# Patient Record
Sex: Female | Born: 1960 | Race: White | Hispanic: No | Marital: Married
Health system: Southern US, Community
[De-identification: ages and names within clinical notes are randomized; demographics above are authoritative.]

---

## 1985-10-31 HISTORY — PX: BREAST EXCISIONAL BIOPSY: SUR124

## 1998-08-11 ENCOUNTER — Other Ambulatory Visit: Admission: RE | Admit: 1998-08-11 | Discharge: 1998-08-11 | Payer: Self-pay | Admitting: Obstetrics and Gynecology

## 1999-03-06 ENCOUNTER — Inpatient Hospital Stay (HOSPITAL_COMMUNITY): Admission: AD | Admit: 1999-03-06 | Discharge: 1999-03-08 | Payer: Self-pay | Admitting: Obstetrics and Gynecology

## 1999-04-16 ENCOUNTER — Other Ambulatory Visit: Admission: RE | Admit: 1999-04-16 | Discharge: 1999-04-16 | Payer: Self-pay | Admitting: *Deleted

## 1999-04-29 ENCOUNTER — Other Ambulatory Visit: Admission: RE | Admit: 1999-04-29 | Discharge: 1999-04-29 | Payer: Self-pay | Admitting: Obstetrics and Gynecology

## 2000-05-25 ENCOUNTER — Other Ambulatory Visit: Admission: RE | Admit: 2000-05-25 | Discharge: 2000-05-25 | Payer: Self-pay | Admitting: Obstetrics and Gynecology

## 2001-05-10 ENCOUNTER — Other Ambulatory Visit: Admission: RE | Admit: 2001-05-10 | Discharge: 2001-05-10 | Payer: Self-pay | Admitting: Internal Medicine

## 2001-06-26 ENCOUNTER — Encounter: Admission: RE | Admit: 2001-06-26 | Discharge: 2001-06-26 | Payer: Self-pay | Admitting: Internal Medicine

## 2001-06-26 ENCOUNTER — Encounter: Payer: Self-pay | Admitting: Internal Medicine

## 2001-10-01 ENCOUNTER — Ambulatory Visit (HOSPITAL_COMMUNITY): Admission: RE | Admit: 2001-10-01 | Discharge: 2001-10-01 | Payer: Self-pay | Admitting: Internal Medicine

## 2001-10-01 ENCOUNTER — Encounter: Payer: Self-pay | Admitting: Internal Medicine

## 2002-10-18 ENCOUNTER — Encounter: Payer: Self-pay | Admitting: Internal Medicine

## 2002-10-18 ENCOUNTER — Encounter: Admission: RE | Admit: 2002-10-18 | Discharge: 2002-10-18 | Payer: Self-pay | Admitting: Internal Medicine

## 2003-11-13 ENCOUNTER — Other Ambulatory Visit: Admission: RE | Admit: 2003-11-13 | Discharge: 2003-11-13 | Payer: Self-pay | Admitting: Internal Medicine

## 2003-11-25 ENCOUNTER — Encounter: Admission: RE | Admit: 2003-11-25 | Discharge: 2003-11-25 | Payer: Self-pay | Admitting: Internal Medicine

## 2004-11-30 ENCOUNTER — Encounter: Admission: RE | Admit: 2004-11-30 | Discharge: 2004-11-30 | Payer: Self-pay | Admitting: Internal Medicine

## 2004-12-27 ENCOUNTER — Other Ambulatory Visit: Admission: RE | Admit: 2004-12-27 | Discharge: 2004-12-27 | Payer: Self-pay | Admitting: Internal Medicine

## 2005-12-06 ENCOUNTER — Encounter: Admission: RE | Admit: 2005-12-06 | Discharge: 2005-12-06 | Payer: Self-pay | Admitting: Internal Medicine

## 2005-12-23 ENCOUNTER — Other Ambulatory Visit: Admission: RE | Admit: 2005-12-23 | Discharge: 2005-12-23 | Payer: Self-pay | Admitting: Obstetrics and Gynecology

## 2006-01-06 ENCOUNTER — Encounter: Admission: RE | Admit: 2006-01-06 | Discharge: 2006-01-06 | Payer: Self-pay | Admitting: Family Medicine

## 2006-09-19 ENCOUNTER — Encounter (INDEPENDENT_AMBULATORY_CARE_PROVIDER_SITE_OTHER): Payer: Self-pay | Admitting: *Deleted

## 2006-09-19 ENCOUNTER — Inpatient Hospital Stay (HOSPITAL_COMMUNITY): Admission: RE | Admit: 2006-09-19 | Discharge: 2006-09-20 | Payer: Self-pay | Admitting: Obstetrics and Gynecology

## 2006-12-29 ENCOUNTER — Encounter: Admission: RE | Admit: 2006-12-29 | Discharge: 2006-12-29 | Payer: Self-pay | Admitting: Family Medicine

## 2007-09-25 ENCOUNTER — Encounter: Admission: RE | Admit: 2007-09-25 | Discharge: 2007-09-25 | Payer: Self-pay | Admitting: Obstetrics and Gynecology

## 2007-12-19 ENCOUNTER — Encounter: Admission: RE | Admit: 2007-12-19 | Discharge: 2007-12-19 | Payer: Self-pay | Admitting: Family Medicine

## 2009-01-01 ENCOUNTER — Encounter: Admission: RE | Admit: 2009-01-01 | Discharge: 2009-01-01 | Payer: Self-pay | Admitting: Family Medicine

## 2010-01-05 ENCOUNTER — Encounter: Admission: RE | Admit: 2010-01-05 | Discharge: 2010-01-05 | Payer: Self-pay | Admitting: Family Medicine

## 2010-12-15 ENCOUNTER — Other Ambulatory Visit: Payer: Self-pay | Admitting: Family Medicine

## 2010-12-15 DIAGNOSIS — Z1231 Encounter for screening mammogram for malignant neoplasm of breast: Secondary | ICD-10-CM

## 2011-01-11 ENCOUNTER — Ambulatory Visit
Admission: RE | Admit: 2011-01-11 | Discharge: 2011-01-11 | Disposition: A | Discharge status: Home / Self Care | Payer: PRIVATE HEALTH INSURANCE | Source: Ambulatory Visit | Attending: Family Medicine | Admitting: Family Medicine

## 2011-01-11 DIAGNOSIS — Z1231 Encounter for screening mammogram for malignant neoplasm of breast: Secondary | ICD-10-CM

## 2011-03-18 NOTE — Op Note (Signed)
Kimberly Franklin, Kimberly Franklin               ACCOUNT NO.:  [id number]   MEDICAL RECORD NO.:  [id number]          PATIENT TYPE:  INP   LOCATION:  9317                          FACILITY:  [facility]   PHYSICIAN:  Randye Lobo, M.D.   DATE OF BIRTH:  05-14-1961   DATE OF PROCEDURE:  09/19/2006  DATE OF DISCHARGE:                               OPERATIVE REPORT   PREOPERATIVE DIAGNOSIS:  1. Incomplete uterovaginal prolapse.  2. Genuine stress incontinence.   POSTOPERATIVE DIAGNOSIS:  1. Incomplete uterovaginal prolapse.  2. Genuine stress incontinence.   PROCEDURE:  Total vaginal hysterectomy, McCall culdoplasty, anterior and  posterior colporrhaphy, TVT sling, cystoscopy.   SURGEON:  Conley Simmonds, M.D.   ASSISTANT:  Lodema Hong, M.D.   ANESTHESIA:  Epidural, local with 0.5% lidocaine with 1:200,000 of  epinephrine.   IV FLUIDS:  3450 mL Ringer's lactate.   ESTIMATED BLOOD LOSS:  300 mL   URINE OUTPUT:  2700 mL   COMPLICATIONS:  None.   INDICATIONS FOR PROCEDURE:  The patient is a 50 year old para 2  Caucasian female who presents with a complaint of urinary incontinence  with forceful maneuvers.  The patient has evidence of stress  incontinence with urodynamic studies.  On pelvic examination the patient  is noted to have a first to second degree cystocele, first to second  degree uterine prolapse, and a second-degree rectocele.  A plan is made  to proceed with a total vaginal hysterectomy with vaginal vault  suspension, anterior and posterior colporrhaphy and tension-free vaginal  tape suburethral sling and cystoscopy after risks, benefits, and  alternatives are discussed.   FINDINGS:  Exam under anesthesia revealed a first degree cystocele,  first-degree uterine prolapse, and a second-degree rectocele.   Cystoscopy demonstrated the absence of a foreign body in the bladder or  in the urethra.  The bladder was visualized throughout 360 degrees  including the bladder dome and trigone.   The ureters were noted to be  patent bilaterally.   SPECIMENS:  The uterus was sent to pathology.   PROCEDURE:  The patient is reidentified in the preoperative hold area.  The patient did receive Ancef 1 gram IV for antibiotic prophylaxis.  She  received both TED hose and PAS stockings for DVT prophylaxis.   Prior to going to the operating room, the patient received an epidural  catheter.  In the operating room her epidural was dosed.  The patient  was noted to have inadequate anesthesia and there was blood return  coming from an epidural catheter.  The epidural catheter was therefore  removed and was replaced by anesthesia.  The epidural then became  functional.   The patient was placed in the dorsal lithotomy position and the lower  abdomen and vagina were sterilely prepped and draped.  A Foley catheter  was placed inside the bladder.  A weighted speculum was placed inside  the vagina and tenaculums were placed on the anterior and posterior  cervical lips.  The cervix was circumferentially injected with half  percent lidocaine with 1:200,000 of epinephrine.  The cervix was  circumscribed with a scalpel.  The posterior cul-de-sac was entered  sharply and digital exam confirmed proper entry into the cul-de-sac.  The posterior vaginal cuff was marked with a suture of 0 Vicryl.  A long  weighted speculum was then placed in the posterior cul-de-sac.  Each of  the uterosacral ligaments were clamped, sharply divided, and suture  ligated with transfixing sutures of 0 Vicryl.  The bladder was dissected  away from the anterior cervix.  A second clamp was placed on the upper  part of the uterosacral ligaments.  They were then sharply divided, and  again suture-ligated with 0 Vicryl.  The anterior cul-de-sac was entered  sharply at this time and again digital exam confirmed proper entry into  this location.  The cardinal ligaments were then sequentially clamped,  sharply divided, and suture  ligated with 0 Vicryl bilaterally.  The  inferior aspects of each of the broad ligaments were clamped, sharply  divided, and suture ligated with 0 Vicryl bilaterally.  The uterus was  inverted at this time and Heaney clamps were placed across the proximal  fallopian tubes and the utero-ovarian ligaments.  The pedicles were then  sharply divided bilaterally.  Each of the pedicles were first tied with  free ties of 0 Vicryl followed by suture ligature of the same.  There  was some bleeding noted above the left uterosacral ligament pedicle and  this responded well to a figure-of-eight suture of 0 Vicryl.  There was  some bleeding noted just above the cardinal ligament pedicle on the  patient's right-hand side and this too responded to a figure-of-eight  suture of 0 Vicryl.   The uterine specimen was sent to pathology.   The pedicles were reexamined at this time and noted to all be  hemostatic.   The posterior vaginal cuff was whip stitched with a running locked  suture of 0 Vicryl.  A McCall culdoplasty was performed at this time.  The suture was brought through the posterior vaginal cuff and into the  posterior cul-de-sac at the 6 o'clock position, through the distal left  uterosacral ligament, across the posterior cul-de-sac in a pursestring  fashion, down to the distal right uterosacral ligament, and then out  through the vagina at the 6 o'clock position.  This suture was held  until the end of the case at which time it was tied for excellent  vaginal cuff elevation and support.   The TVT sling and the anterior colporrhaphy were performed next.  Allis  clamps were used to divide the midline of the anterior vaginal mucosa in  the midline.  The vaginal mucosa was injected with half percent  lidocaine with 1:200,000 of epinephrine.  The vaginal mucosa was incised  vertically with a Metzenbaum scissors.  The subvaginal tissue and bladder were dissected off of the overlying vaginal mucosa  bilaterally.  The dissection was carried back to the pubic rami bilaterally and down  to the uterosacral ligaments inferiorly.   The TVT sling was then performed in a top-down fashion.  1 cm suprapubic  incisions were created 2.5 cm to the right and left of the midline  suprapubic region.  The TVT needle was placed first through the right  suprapubic incision and then out through the vagina at the level of the  mid urethra and lateral to it on the ipsilateral side.  The same  procedure that was performed on the right-hand side was then repeated on  the left-hand side through that suprapubic incision.  The Foley catheter  was removed and cystoscopy was performed and the findings are as noted  above.  The Foley catheter was then replaced and the bladder was drained  and the sling was attached to the abdominal needle passers and drawn up  through the suprapubic incisions.  There was some bleeding noted along  the exit site of the left suprapubic incision and there was some  significant bleeding noted along the exit site of the left vaginal  incision and it did track up through the left suprapubic incision.  The  plastic sheaths were separated from the surrounding sling after the  abdominal needle passers were disconnected from the apparatus.  A Kelly  clamp was placed between the sling and the urethra as the plastic  sheaths were removed.  There was still some bleeding noted along the  patient's left-hand vaginal incision and two figure-of-eight sutures of  2-0 Vicryl were used to create hemostasis.  During placement of the  sutures, the sling was incorporated in the figure-of-eight sutures.  Hemostasis was good, however the sling itself had a tethered appearance  and the decision was made to remove the sling and place an entirely new  apparatus.  The sutures for hemostasis were removed along the left-hand  side of the sling.  The sling was then cut in the midline.  Each half of  the sling  was removed without difficulty from the vaginal aspect of the  dissection and operation.   Again with a Foley catheter left to gravity drainage the TVT procedure  was repeated again in a top-down fashion.  Each of the abdominal needle  passers were passed without difficulty.  The Foley catheter was removed  and cystoscopy was repeated. Again there was no evidence of a foreign  body nor evidence of any cystotomy and the ureters were once again noted  to be patent.  The new sling was attached to the abdominal needle  passers and drawn up to the suprapubic incisions.  Again a Kelly clamp  was placed between the sling and the urethra and the plastic sheaths  were removed.  The sling was noted to be in excellent position with  proper tension.  The bleeding vessel over the bladder was suture ligated  with a figure-of-eight suture of 2-0 Vicryl which did not incorporate  the sling.  The anterior colporrhaphy was then performed by placing vertical mattress sutures of 2-0 Vicryl for excellent reduction of the  cystocele.  Excess vaginal mucosa was trimmed and the anterior vaginal  wall was closed with a running locked suture of 2-0 Vicryl which  continued along the vaginal cuff for closure of the vaginal cuff at the  same time.   The posterior colporrhaphy was performed last.  Allis clamps were used  to mark the midline of the posterior vaginal wall.  The posterior  vaginal mucosa was injected with half percent lidocaine with 1:200,000  of epinephrine.  A triangular wedge of epithelium was excised from the  perineal body and the posterior vaginal wall was then incised in a  vertical fashion using a Metzenbaum scissors.  The perirectal fascia was  dissected from the overlying vaginal mucosa bilaterally to a level of  approximately 2.5 cm below the McCall culdoplasty suture.  The reduction  of the rectocele was performed with interrupted sutures of 2-0 Vicryl at  the top of the repair which  transitioned down to mattress sutures of 2-0  Vicryl.  Along the perineum mattress sutures of 0 Vicryl were placed.  Excess vaginal mucosa was then trimmed and posterior vaginal wall and  the posterior vagina was closed with a running locked suture of 2-0  Vicryl which continued along the perineum in a subcuticular fashion as  for an episiotomy.   The McCall culdoplasty suture was tied at this time.   The suprapubic incisions were closed with Dermabond.   Rectal exam confirmed the absence of sutures in the rectum at this time.  A gauze packing with Estrace cream was placed in the vagina and a Foley  catheter continued to gravity drainage.   This concluded the patient's procedure.  There were no complications.  All needle, instrument, sponge counts were correct.      Randye Lobo, M.D.  Electronically Signed     BES/MEDQ  D:  09/19/2006  T:  09/20/2006  Job:  [id number]

## 2011-06-30 ENCOUNTER — Other Ambulatory Visit: Payer: Self-pay | Admitting: Dermatology

## 2011-12-13 ENCOUNTER — Other Ambulatory Visit: Payer: Self-pay | Admitting: Family Medicine

## 2011-12-13 DIAGNOSIS — Z1231 Encounter for screening mammogram for malignant neoplasm of breast: Secondary | ICD-10-CM

## 2012-01-12 ENCOUNTER — Ambulatory Visit
Admission: RE | Admit: 2012-01-12 | Discharge: 2012-01-12 | Disposition: A | Discharge status: Home / Self Care | Payer: PRIVATE HEALTH INSURANCE | Source: Ambulatory Visit | Attending: Family Medicine | Admitting: Family Medicine

## 2012-01-12 DIAGNOSIS — Z1231 Encounter for screening mammogram for malignant neoplasm of breast: Secondary | ICD-10-CM

## 2012-12-17 ENCOUNTER — Other Ambulatory Visit: Payer: Self-pay | Admitting: Family Medicine

## 2012-12-17 DIAGNOSIS — Z1231 Encounter for screening mammogram for malignant neoplasm of breast: Secondary | ICD-10-CM

## 2013-01-15 ENCOUNTER — Ambulatory Visit
Admission: RE | Admit: 2013-01-15 | Discharge: 2013-01-15 | Disposition: A | Discharge status: Home / Self Care | Payer: BC Managed Care – PPO | Source: Ambulatory Visit | Attending: Family Medicine | Admitting: Family Medicine

## 2013-01-15 DIAGNOSIS — Z1231 Encounter for screening mammogram for malignant neoplasm of breast: Secondary | ICD-10-CM

## 2013-12-16 ENCOUNTER — Other Ambulatory Visit: Payer: Self-pay

## 2013-12-16 DIAGNOSIS — Z1231 Encounter for screening mammogram for malignant neoplasm of breast: Secondary | ICD-10-CM

## 2014-01-16 ENCOUNTER — Ambulatory Visit
Admission: RE | Admit: 2014-01-16 | Discharge: 2014-01-16 | Disposition: A | Discharge status: Home / Self Care | Payer: BC Managed Care – PPO | Source: Ambulatory Visit

## 2014-01-16 DIAGNOSIS — Z1231 Encounter for screening mammogram for malignant neoplasm of breast: Secondary | ICD-10-CM

## 2014-12-16 ENCOUNTER — Other Ambulatory Visit: Payer: Self-pay

## 2014-12-16 DIAGNOSIS — Z1231 Encounter for screening mammogram for malignant neoplasm of breast: Secondary | ICD-10-CM

## 2015-01-20 ENCOUNTER — Ambulatory Visit
Admission: RE | Admit: 2015-01-20 | Discharge: 2015-01-20 | Disposition: A | Discharge status: Home / Self Care | Payer: BLUE CROSS/BLUE SHIELD | Source: Ambulatory Visit

## 2015-01-20 ENCOUNTER — Encounter (INDEPENDENT_AMBULATORY_CARE_PROVIDER_SITE_OTHER): Payer: Self-pay

## 2015-01-20 DIAGNOSIS — Z1231 Encounter for screening mammogram for malignant neoplasm of breast: Secondary | ICD-10-CM

## 2015-10-28 ENCOUNTER — Other Ambulatory Visit: Payer: Self-pay | Admitting: Physician Assistant

## 2015-10-28 ENCOUNTER — Ambulatory Visit
Admission: RE | Admit: 2015-10-28 | Discharge: 2015-10-28 | Disposition: A | Discharge status: Home / Self Care | Payer: BLUE CROSS/BLUE SHIELD | Source: Ambulatory Visit | Attending: Physician Assistant | Admitting: Physician Assistant

## 2015-10-28 DIAGNOSIS — R5383 Other fatigue: Secondary | ICD-10-CM

## 2015-10-28 DIAGNOSIS — R059 Cough, unspecified: Secondary | ICD-10-CM

## 2015-10-28 DIAGNOSIS — R05 Cough: Secondary | ICD-10-CM

## 2015-12-21 ENCOUNTER — Other Ambulatory Visit: Payer: Self-pay

## 2015-12-21 DIAGNOSIS — Z1231 Encounter for screening mammogram for malignant neoplasm of breast: Secondary | ICD-10-CM

## 2016-01-21 ENCOUNTER — Ambulatory Visit
Admission: RE | Admit: 2016-01-21 | Discharge: 2016-01-21 | Disposition: A | Discharge status: Home / Self Care | Payer: BLUE CROSS/BLUE SHIELD | Source: Ambulatory Visit

## 2016-01-21 DIAGNOSIS — Z1231 Encounter for screening mammogram for malignant neoplasm of breast: Secondary | ICD-10-CM

## 2016-12-21 ENCOUNTER — Other Ambulatory Visit: Payer: Self-pay | Admitting: Family Medicine

## 2016-12-21 DIAGNOSIS — Z1231 Encounter for screening mammogram for malignant neoplasm of breast: Secondary | ICD-10-CM

## 2017-01-24 ENCOUNTER — Ambulatory Visit
Admission: RE | Admit: 2017-01-24 | Discharge: 2017-01-24 | Disposition: A | Discharge status: Home / Self Care | Payer: PRIVATE HEALTH INSURANCE | Source: Ambulatory Visit | Attending: Family Medicine | Admitting: Family Medicine

## 2017-01-24 DIAGNOSIS — Z1231 Encounter for screening mammogram for malignant neoplasm of breast: Secondary | ICD-10-CM

## 2017-04-13 ENCOUNTER — Other Ambulatory Visit: Payer: Self-pay | Admitting: Family Medicine

## 2017-04-13 DIAGNOSIS — G4484 Primary exertional headache: Secondary | ICD-10-CM

## 2017-04-24 ENCOUNTER — Ambulatory Visit
Admission: RE | Admit: 2017-04-24 | Discharge: 2017-04-24 | Disposition: A | Discharge status: Home / Self Care | Payer: PRIVATE HEALTH INSURANCE | Source: Ambulatory Visit | Attending: Family Medicine | Admitting: Family Medicine

## 2017-04-24 DIAGNOSIS — G4484 Primary exertional headache: Secondary | ICD-10-CM

## 2017-04-24 MED ORDER — GADOBENATE DIMEGLUMINE 529 MG/ML IV SOLN
14.0000 mL | Freq: Once | INTRAVENOUS | Status: AC | PRN
  Administered 2017-04-24: 14 mL via INTRAVENOUS

## 2017-12-28 ENCOUNTER — Other Ambulatory Visit: Payer: Self-pay | Admitting: Family Medicine

## 2017-12-28 DIAGNOSIS — Z1231 Encounter for screening mammogram for malignant neoplasm of breast: Secondary | ICD-10-CM

## 2018-02-06 ENCOUNTER — Ambulatory Visit
Admission: RE | Admit: 2018-02-06 | Discharge: 2018-02-06 | Disposition: A | Discharge status: Home / Self Care | Payer: PRIVATE HEALTH INSURANCE | Source: Ambulatory Visit | Attending: Family Medicine | Admitting: Family Medicine

## 2018-02-06 DIAGNOSIS — Z1231 Encounter for screening mammogram for malignant neoplasm of breast: Secondary | ICD-10-CM

## 2019-01-14 ENCOUNTER — Other Ambulatory Visit: Payer: Self-pay | Admitting: Family Medicine

## 2019-01-14 DIAGNOSIS — Z1231 Encounter for screening mammogram for malignant neoplasm of breast: Secondary | ICD-10-CM

## 2019-02-18 ENCOUNTER — Ambulatory Visit: Payer: PRIVATE HEALTH INSURANCE

## 2019-04-02 ENCOUNTER — Ambulatory Visit
Admission: RE | Admit: 2019-04-02 | Discharge: 2019-04-02 | Disposition: A | Discharge status: Home / Self Care | Payer: PRIVATE HEALTH INSURANCE | Source: Ambulatory Visit | Attending: Family Medicine | Admitting: Family Medicine

## 2019-04-02 ENCOUNTER — Other Ambulatory Visit: Payer: Self-pay

## 2019-04-02 DIAGNOSIS — Z1231 Encounter for screening mammogram for malignant neoplasm of breast: Secondary | ICD-10-CM

## 2019-04-03 ENCOUNTER — Other Ambulatory Visit: Payer: Self-pay | Admitting: Family Medicine

## 2019-04-03 DIAGNOSIS — R928 Other abnormal and inconclusive findings on diagnostic imaging of breast: Secondary | ICD-10-CM

## 2019-04-08 ENCOUNTER — Other Ambulatory Visit: Payer: PRIVATE HEALTH INSURANCE

## 2019-04-12 ENCOUNTER — Other Ambulatory Visit: Payer: PRIVATE HEALTH INSURANCE

## 2019-04-16 ENCOUNTER — Other Ambulatory Visit: Payer: Self-pay

## 2019-04-16 ENCOUNTER — Ambulatory Visit
Admission: RE | Admit: 2019-04-16 | Discharge: 2019-04-16 | Disposition: A | Discharge status: Home / Self Care | Payer: PRIVATE HEALTH INSURANCE | Source: Ambulatory Visit | Attending: Family Medicine | Admitting: Family Medicine

## 2019-04-16 DIAGNOSIS — R928 Other abnormal and inconclusive findings on diagnostic imaging of breast: Secondary | ICD-10-CM

## 2019-04-22 ENCOUNTER — Other Ambulatory Visit: Payer: PRIVATE HEALTH INSURANCE

## 2019-12-28 ENCOUNTER — Ambulatory Visit: Payer: PRIVATE HEALTH INSURANCE | Attending: Internal Medicine

## 2019-12-28 DIAGNOSIS — Z23 Encounter for immunization: Secondary | ICD-10-CM | POA: Insufficient documentation

## 2019-12-28 NOTE — Progress Notes (Signed)
   Covid-19 Vaccination Clinic  Name:  AMORY ZBIKOWSKI    MRN: [id number] DOB: February 16, 1961  12/28/2019  Ms. Bognar was observed post Covid-19 immunization for 15 minutes without incidence. She was provided with Vaccine Information Sheet and instruction to access the V-Safe system.   Ms. Yackel was instructed to call 911 with any severe reactions post vaccine: Marland Kitchen Difficulty breathing  . Swelling of your face and throat  . A fast heartbeat  . A bad rash all over your body  . Dizziness and weakness    Immunizations Administered    Name Date Dose VIS Date Route   Pfizer COVID-19 Vaccine 12/28/2019 11:10 AM 0.3 mL 10/11/2019 Intramuscular   Manufacturer: [employer], [employer]   Lot: [id number]   NDC: [address]-6

## 2020-01-21 ENCOUNTER — Ambulatory Visit: Payer: PRIVATE HEALTH INSURANCE | Attending: Internal Medicine

## 2020-01-21 DIAGNOSIS — Z23 Encounter for immunization: Secondary | ICD-10-CM

## 2020-01-21 NOTE — Progress Notes (Signed)
   Covid-19 Vaccination Clinic  Name:  Kimberly Franklin    MRN: [id number] DOB: 1961-08-23  01/21/2020  [name] was observed post Covid-19 immunization for 15 minutes without incident. She was provided with Vaccine Information Sheet and instruction to access the V-Safe system.   [name] was instructed to call 911 with any severe reactions post vaccine: Marland Kitchen Difficulty breathing  . Swelling of face and throat  . A fast heartbeat  . A bad rash all over body  . Dizziness and weakness   Immunizations Administered    Name Date Dose VIS Date Route   Pfizer COVID-19 Vaccine 01/21/2020  8:41 AM 0.3 mL 10/11/2019 Intramuscular   Manufacturer: [employer], [employer]   Lot: [id number]   NDC: [address]-6

## 2020-01-22 ENCOUNTER — Ambulatory Visit: Payer: PRIVATE HEALTH INSURANCE

## 2020-03-24 ENCOUNTER — Other Ambulatory Visit: Payer: Self-pay | Admitting: Family Medicine

## 2020-03-24 DIAGNOSIS — Z1231 Encounter for screening mammogram for malignant neoplasm of breast: Secondary | ICD-10-CM

## 2020-04-22 ENCOUNTER — Other Ambulatory Visit: Payer: Self-pay

## 2020-04-22 ENCOUNTER — Ambulatory Visit
Admission: RE | Admit: 2020-04-22 | Discharge: 2020-04-22 | Disposition: A | Discharge status: Home / Self Care | Payer: PRIVATE HEALTH INSURANCE | Source: Ambulatory Visit

## 2020-04-22 DIAGNOSIS — Z1231 Encounter for screening mammogram for malignant neoplasm of breast: Secondary | ICD-10-CM

## 2020-04-27 ENCOUNTER — Other Ambulatory Visit: Payer: Self-pay | Admitting: Family Medicine

## 2020-04-27 DIAGNOSIS — R928 Other abnormal and inconclusive findings on diagnostic imaging of breast: Secondary | ICD-10-CM

## 2020-05-08 ENCOUNTER — Other Ambulatory Visit: Payer: Self-pay

## 2020-05-08 ENCOUNTER — Ambulatory Visit: Payer: PRIVATE HEALTH INSURANCE

## 2020-05-08 ENCOUNTER — Ambulatory Visit
Admission: RE | Admit: 2020-05-08 | Discharge: 2020-05-08 | Disposition: A | Discharge status: Home / Self Care | Payer: PRIVATE HEALTH INSURANCE | Source: Ambulatory Visit | Attending: Family Medicine | Admitting: Family Medicine

## 2020-05-08 DIAGNOSIS — R928 Other abnormal and inconclusive findings on diagnostic imaging of breast: Secondary | ICD-10-CM

## 2020-07-19 ENCOUNTER — Other Ambulatory Visit (HOSPITAL_COMMUNITY): Payer: Self-pay | Admitting: Nurse Practitioner

## 2020-07-19 DIAGNOSIS — U071 COVID-19: Secondary | ICD-10-CM

## 2020-07-19 DIAGNOSIS — Z6825 Body mass index (BMI) 25.0-25.9, adult: Secondary | ICD-10-CM

## 2020-07-19 NOTE — Progress Notes (Signed)
I connected by phone with Kimberly Franklin on 07/19/2020 at 3:11 PM to discuss the potential use of a new treatment for mild to moderate COVID-19 viral infection in non-hospitalized patients.  This patient is a 59 y.o. female that meets the FDA criteria for Emergency Use Authorization of COVID monoclonal antibody casirivimab/imdevimab.  Has a (+) direct SARS-CoV-2 viral test result  Has mild or moderate COVID-19   Is NOT hospitalized due to COVID-19  Is within 10 days of symptom onset  Has at least one of the high risk factor(s) for progression to severe COVID-19 and/or hospitalization as defined in EUA.  Specific high risk criteria : BMI > 25   I have spoken and communicated the following to the patient or parent/caregiver regarding COVID monoclonal antibody treatment:  1. FDA has authorized the emergency use for the treatment of mild to moderate COVID-19 in adults and pediatric patients with positive results of direct SARS-CoV-2 viral testing who are 86 years of age and older weighing at least 40 kg, and who are at high risk for progressing to severe COVID-19 and/or hospitalization.  2. The significant known and potential risks and benefits of COVID monoclonal antibody, and the extent to which such potential risks and benefits are unknown.  3. Information on available alternative treatments and the risks and benefits of those alternatives, including clinical trials.  4. Patients treated with COVID monoclonal antibody should continue to self-isolate and use infection control measures (e.g., wear mask, isolate, social distance, avoid sharing personal items, clean and disinfect high touch surfaces, and frequent handwashing) according to CDC guidelines.   5. The patient or parent/caregiver has the option to accept or refuse COVID monoclonal antibody treatment.  After reviewing this information with the patient, The patient agreed to proceed with receiving casirivimab\imdevimab infusion  and will be provided a copy of the Fact sheet prior to receiving the infusion. [name] [name] 07/19/2020 3:11 PM

## 2020-07-20 ENCOUNTER — Other Ambulatory Visit (HOSPITAL_COMMUNITY): Payer: Self-pay

## 2020-07-20 ENCOUNTER — Ambulatory Visit (HOSPITAL_COMMUNITY)
Admission: RE | Admit: 2020-07-20 | Discharge: 2020-07-20 | Disposition: A | Discharge status: Home / Self Care | Payer: PRIVATE HEALTH INSURANCE | Source: Ambulatory Visit | Attending: Pulmonary Disease | Admitting: Pulmonary Disease

## 2020-07-20 DIAGNOSIS — Z6825 Body mass index (BMI) 25.0-25.9, adult: Secondary | ICD-10-CM | POA: Diagnosis present

## 2020-07-20 DIAGNOSIS — U071 COVID-19: Secondary | ICD-10-CM | POA: Insufficient documentation

## 2020-07-20 MED ORDER — SODIUM CHLORIDE 0.9 % IV SOLN
1200.0000 mg | Freq: Once | INTRAVENOUS | Status: AC
  Administered 2020-07-20: 1200 mg via INTRAVENOUS

## 2020-07-20 MED ORDER — ALBUTEROL SULFATE HFA 108 (90 BASE) MCG/ACT IN AERS: 2.0000 | INHALATION_SPRAY | Freq: Once | RESPIRATORY_TRACT | Status: DC | PRN

## 2020-07-20 MED ORDER — FAMOTIDINE IN NACL 20-0.9 MG/50ML-% IV SOLN: 20.0000 mg | Freq: Once | INTRAVENOUS | Status: DC | PRN

## 2020-07-20 MED ORDER — SODIUM CHLORIDE 0.9 % IV SOLN: INTRAVENOUS | Status: DC | PRN

## 2020-07-20 MED ORDER — METHYLPREDNISOLONE SODIUM SUCC 125 MG IJ SOLR: 125.0000 mg | Freq: Once | INTRAMUSCULAR | Status: DC | PRN

## 2020-07-20 MED ORDER — EPINEPHRINE 0.3 MG/0.3ML IJ SOAJ: 0.3000 mg | Freq: Once | INTRAMUSCULAR | Status: DC | PRN

## 2020-07-20 MED ORDER — DIPHENHYDRAMINE HCL 50 MG/ML IJ SOLN: 50.0000 mg | Freq: Once | INTRAMUSCULAR | Status: DC | PRN

## 2020-07-20 NOTE — Progress Notes (Signed)
  Diagnosis: COVID-19  Physician: Dr. Patrick Wright  Procedure: Covid Infusion Clinic Med: casirivimab\imdevimab infusion - Provided patient with casirivimab\imdevimab fact sheet for patients, parents and caregivers prior to infusion.  Complications: No immediate complications noted.  Discharge: Discharged home   Ally Yow 07/20/2020   

## 2020-07-20 NOTE — Discharge Instructions (Signed)

## 2021-04-02 ENCOUNTER — Other Ambulatory Visit: Payer: Self-pay | Admitting: Family Medicine

## 2021-04-02 DIAGNOSIS — Z1231 Encounter for screening mammogram for malignant neoplasm of breast: Secondary | ICD-10-CM

## 2021-05-28 ENCOUNTER — Other Ambulatory Visit: Payer: Self-pay

## 2021-05-28 ENCOUNTER — Ambulatory Visit
Admission: RE | Admit: 2021-05-28 | Discharge: 2021-05-28 | Disposition: A | Discharge status: Home / Self Care | Payer: No Typology Code available for payment source | Source: Ambulatory Visit

## 2021-05-28 DIAGNOSIS — Z1231 Encounter for screening mammogram for malignant neoplasm of breast: Secondary | ICD-10-CM

## 2021-07-08 ENCOUNTER — Other Ambulatory Visit: Payer: Self-pay | Admitting: Family Medicine

## 2021-07-08 DIAGNOSIS — E2839 Other primary ovarian failure: Secondary | ICD-10-CM

## 2021-07-22 ENCOUNTER — Ambulatory Visit
Admission: RE | Admit: 2021-07-22 | Discharge: 2021-07-22 | Disposition: A | Discharge status: Home / Self Care | Payer: No Typology Code available for payment source | Source: Ambulatory Visit | Attending: Family Medicine | Admitting: Family Medicine

## 2021-07-22 ENCOUNTER — Other Ambulatory Visit: Payer: Self-pay

## 2021-07-22 DIAGNOSIS — E2839 Other primary ovarian failure: Secondary | ICD-10-CM

## 2021-11-10 ENCOUNTER — Other Ambulatory Visit: Payer: Self-pay | Admitting: Sports Medicine

## 2021-11-10 ENCOUNTER — Ambulatory Visit
Admission: RE | Admit: 2021-11-10 | Discharge: 2021-11-10 | Disposition: A | Discharge status: Home / Self Care | Payer: No Typology Code available for payment source | Source: Ambulatory Visit | Attending: Sports Medicine | Admitting: Sports Medicine

## 2021-11-10 DIAGNOSIS — M25562 Pain in left knee: Secondary | ICD-10-CM

## 2022-04-18 ENCOUNTER — Other Ambulatory Visit: Payer: Self-pay | Admitting: Family Medicine

## 2022-04-18 DIAGNOSIS — Z1231 Encounter for screening mammogram for malignant neoplasm of breast: Secondary | ICD-10-CM

## 2022-06-07 ENCOUNTER — Ambulatory Visit
Admission: RE | Admit: 2022-06-07 | Discharge: 2022-06-07 | Disposition: A | Discharge status: Home / Self Care | Payer: No Typology Code available for payment source | Source: Ambulatory Visit | Attending: Family Medicine | Admitting: Family Medicine

## 2022-06-07 DIAGNOSIS — Z1231 Encounter for screening mammogram for malignant neoplasm of breast: Secondary | ICD-10-CM

## 2023-05-08 ENCOUNTER — Other Ambulatory Visit: Payer: Self-pay | Admitting: Family Medicine

## 2023-05-08 DIAGNOSIS — Z1231 Encounter for screening mammogram for malignant neoplasm of breast: Secondary | ICD-10-CM

## 2023-06-09 ENCOUNTER — Ambulatory Visit
Admission: RE | Admit: 2023-06-09 | Discharge: 2023-06-09 | Disposition: A | Discharge status: Home / Self Care | Payer: No Typology Code available for payment source | Source: Ambulatory Visit

## 2023-06-09 DIAGNOSIS — Z1231 Encounter for screening mammogram for malignant neoplasm of breast: Secondary | ICD-10-CM

## 2023-08-31 ENCOUNTER — Other Ambulatory Visit: Payer: Self-pay | Admitting: Family Medicine

## 2023-08-31 DIAGNOSIS — M8588 Other specified disorders of bone density and structure, other site: Secondary | ICD-10-CM

## 2023-09-12 IMAGING — DX DG KNEE 3 VIEWS*L*
3 series · 3 of 3 positions shown · non-contrast
Comparison: None.

CLINICAL DATA: Left knee pain

EXAM:
LEFT KNEE - 3 VIEW

[dg knee 3 views left (1 of 3)]
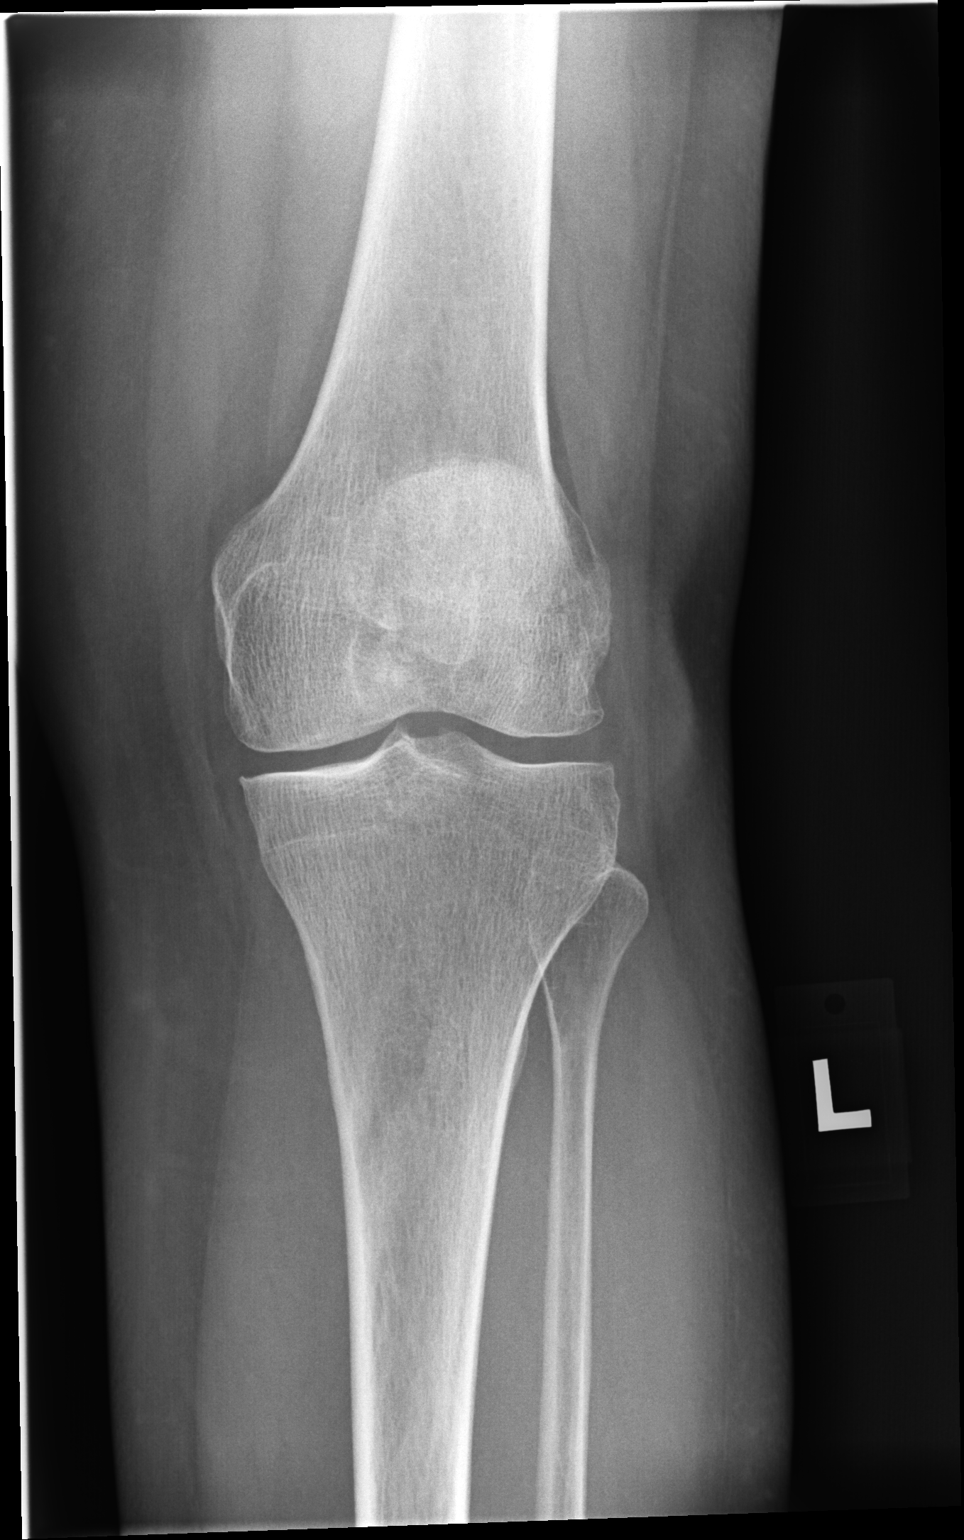

[dg knee 3 views left (2 of 3)]
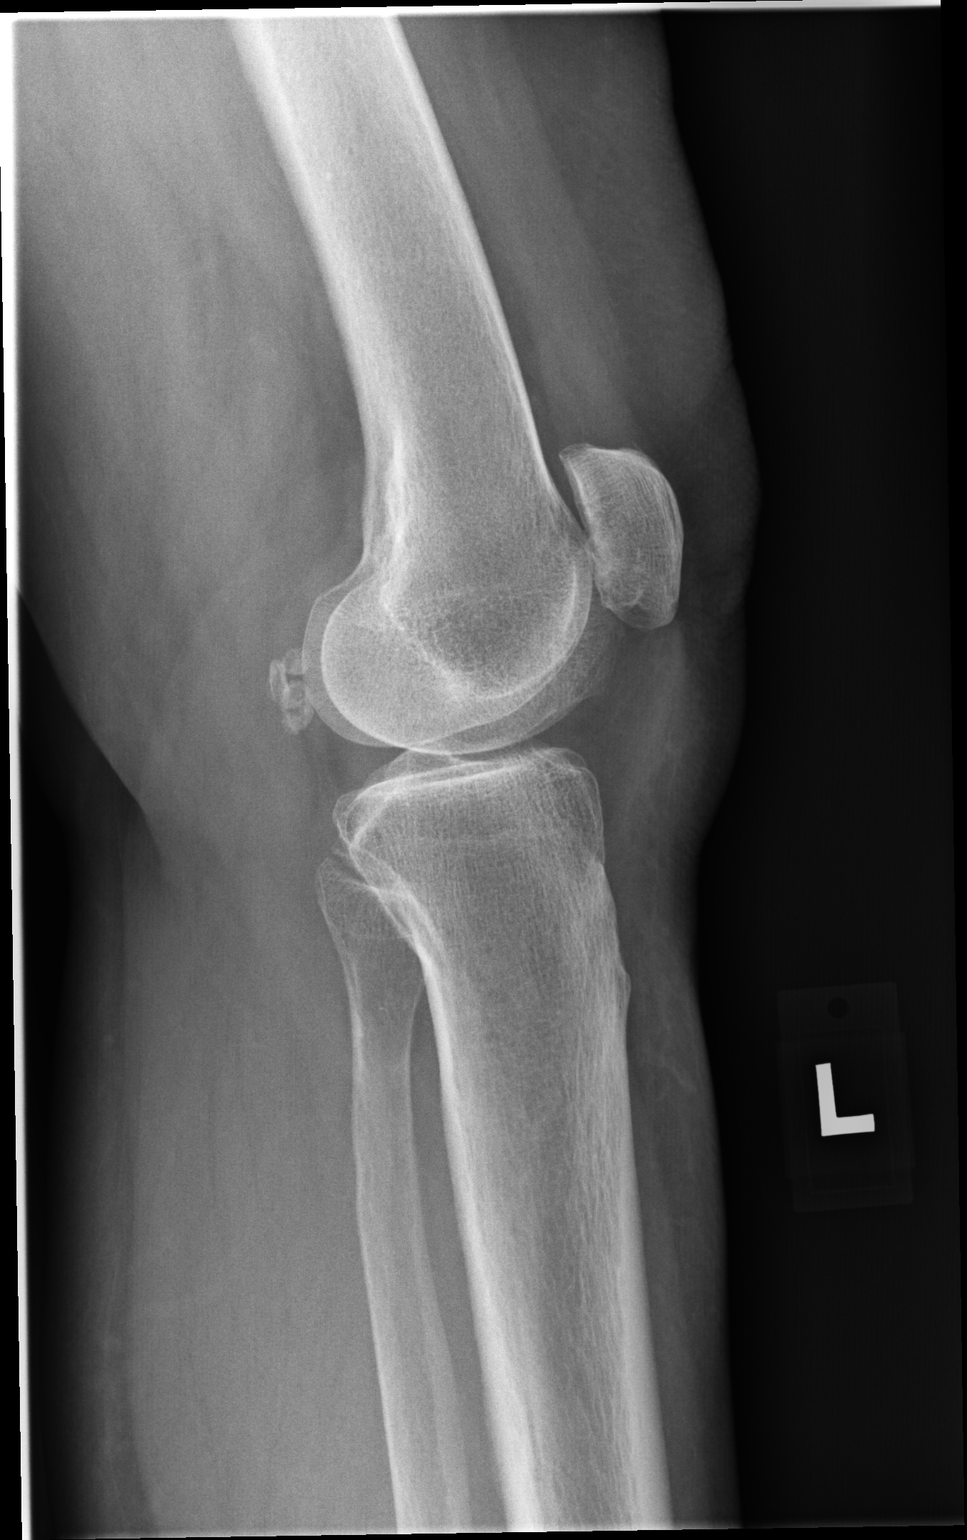

[dg knee 3 views left (3 of 3)]
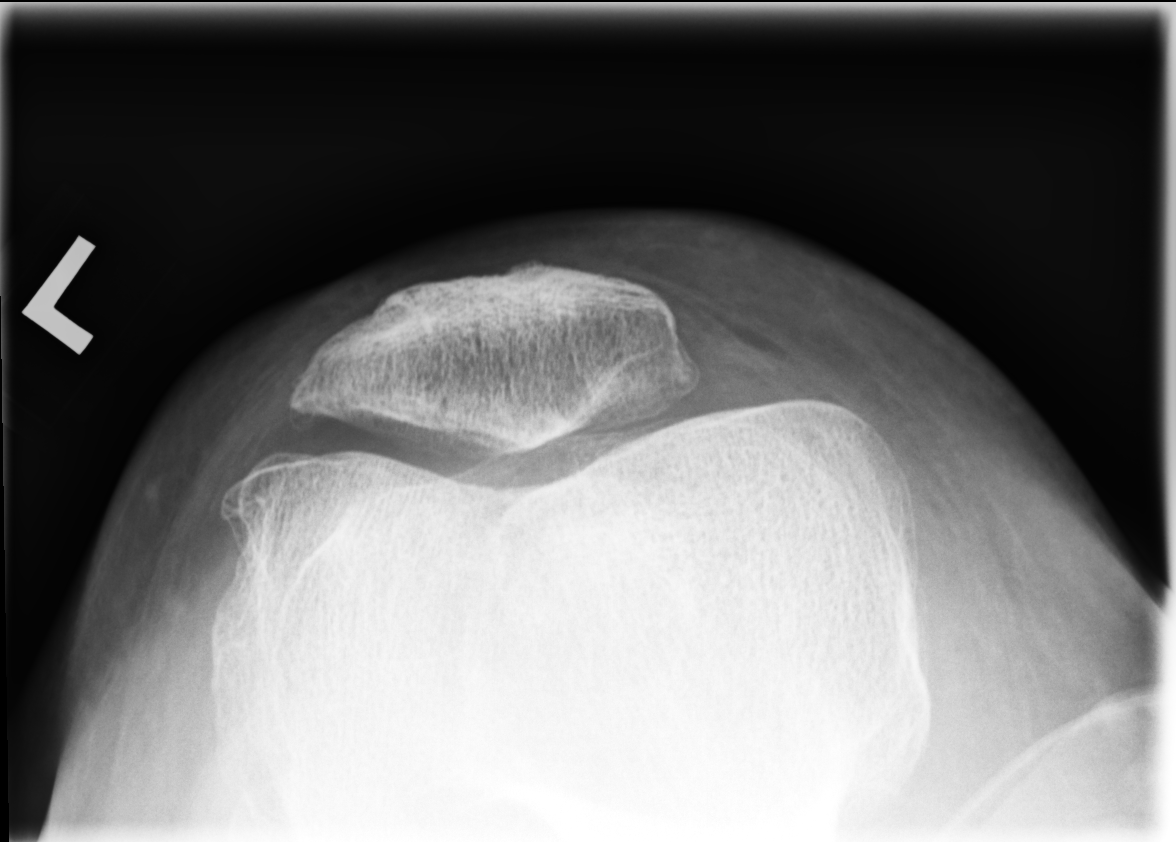

[3 of 3 positions shown; findings below may reference images not displayed]

FINDINGS: Normal alignment. No acute fracture or dislocation. Mild
tricompartmental degenerative arthritis. Multiple ossified densities
measuring up to 8 mm in diameter posterior to the femoral condyles
may represent intra-articular loose bodies. No effusion. Soft
tissues are unremarkable.
IMPRESSION: Mild tricompartmental degenerative arthritis.

Possible intra-articular loose bodies. This could be confirmed with
MRI examination.

## 2023-11-15 ENCOUNTER — Other Ambulatory Visit: Payer: Self-pay | Admitting: Medical Genetics

## 2024-04-16 ENCOUNTER — Other Ambulatory Visit: Payer: No Typology Code available for payment source

## 2024-05-14 ENCOUNTER — Other Ambulatory Visit: Payer: Self-pay | Admitting: Family Medicine

## 2024-05-14 DIAGNOSIS — Z1231 Encounter for screening mammogram for malignant neoplasm of breast: Secondary | ICD-10-CM

## 2024-06-13 ENCOUNTER — Ambulatory Visit
Admission: RE | Admit: 2024-06-13 | Discharge: 2024-06-13 | Disposition: A | Discharge status: Home / Self Care | Source: Ambulatory Visit | Attending: Family Medicine | Admitting: Family Medicine

## 2024-06-13 DIAGNOSIS — Z1231 Encounter for screening mammogram for malignant neoplasm of breast: Secondary | ICD-10-CM

## 2024-06-20 ENCOUNTER — Other Ambulatory Visit: Payer: No Typology Code available for payment source

## 2024-08-08 ENCOUNTER — Other Ambulatory Visit (HOSPITAL_BASED_OUTPATIENT_CLINIC_OR_DEPARTMENT_OTHER)

## 2024-08-23 ENCOUNTER — Other Ambulatory Visit: Payer: Self-pay | Admitting: Medical Genetics

## 2024-08-23 DIAGNOSIS — Z006 Encounter for examination for normal comparison and control in clinical research program: Secondary | ICD-10-CM

## 2024-12-31 ENCOUNTER — Other Ambulatory Visit (HOSPITAL_BASED_OUTPATIENT_CLINIC_OR_DEPARTMENT_OTHER)
